# Patient Record
Sex: Female | Born: 1965 | Race: Black or African American | Hispanic: No | Smoking: Never smoker
Health system: Southern US, Community
[De-identification: ages and names within clinical notes are randomized; demographics above are authoritative.]

## PROBLEM LIST (undated history)

## (undated) DIAGNOSIS — E559 Vitamin D deficiency, unspecified: Secondary | ICD-10-CM

## (undated) DIAGNOSIS — D509 Iron deficiency anemia, unspecified: Secondary | ICD-10-CM

## (undated) DIAGNOSIS — Z8669 Personal history of other diseases of the nervous system and sense organs: Secondary | ICD-10-CM

## (undated) HISTORY — DX: Vitamin D deficiency, unspecified: E55.9

## (undated) HISTORY — DX: Iron deficiency anemia, unspecified: D50.9

## (undated) HISTORY — DX: Personal history of other diseases of the nervous system and sense organs: Z86.69

---

## 2016-01-19 ENCOUNTER — Telehealth: Payer: Self-pay | Admitting: Cardiology

## 2016-01-19 NOTE — Telephone Encounter (Signed)
Received records from [facility] for appointment on 02/07/16 with Dr Herbie BaltimoreHarding.  [facility] for Dr Elissa HeftyHarding's schedule on 02/07/16. lp

## 2016-02-07 ENCOUNTER — Ambulatory Visit (INDEPENDENT_AMBULATORY_CARE_PROVIDER_SITE_OTHER): Payer: Commercial Managed Care - HMO | Admitting: Cardiology

## 2016-02-07 ENCOUNTER — Encounter: Payer: Self-pay | Admitting: Cardiology

## 2016-02-07 VITALS — BP 100/66 | HR 77 | Ht 66.5 in | Wt 135.4 lb

## 2016-02-07 DIAGNOSIS — R002 Palpitations: Secondary | ICD-10-CM

## 2016-02-07 DIAGNOSIS — R9431 Abnormal electrocardiogram [ECG] [EKG]: Secondary | ICD-10-CM

## 2016-02-07 NOTE — Progress Notes (Signed)
PCP: Farris Has, MD  Clinic Note: Chief Complaint  Patient presents with  . New Evaluation    Referred by Farris Has, MD for evlauation of RBBB  pt states no Sx.     HPI: Akita Maxim is a 50 y.o. female with a PMH below who presents today for abnormal EKG - RBBB & HTN.She takes spironolactone for hair growth, and had some hyperkalemia. She is therefore now on HCTZ for additional blood pressure control and cataract in the potassium load.  I don't have the EKG from PCP visit.  Romilda Proby was last seen on 01/16/2016 by her PCP for sensation of heart beating fast. It happened a couple nights and then on the day of evaluation. These episodes were not associated with any dyspnea or chest discomfort or dizziness. No syncope/near syncope.   Associated with drinking a shot espresso. Think there was concern of possible atrial flutter.  Recent Hospitalizations: None  Studies Reviewed: None  Interval History: Ms. Grandison presents here today for evaluation of an abnormal EKG read as a right bundle branch block. She noted some palpitation episodes. In early May, for about a week she had several episodes of feeling her heart rate being fast and regular. She felt like she needs a good deep breath to try to stop it. The episodes only lasted less than a few minutes. On the first a wraparound to 3 times as it did the second day. On the third day she went to her PCPs office. She saw PA 2*MM back to see the PCP after the weekend. That time she noted there was despite every other day. It happened at work during lunch. It was not this of exertional. She thinks the symptoms improved with walking around.  Unfortunately, she has not had any further episodes of palpitations since her PCP visit.  No chest pain or shortness of breath with rest or exertion.  No PND, orthopnea or edema.  No lightheadedness, dizziness, weakness or syncope/near syncope. No TIA/amaurosis fugax symptoms. No melena,  hematochezia, hematuria, or epstaxis. No claudication.  ROS: A comprehensive was performed. Review of Systems  Constitutional: Negative for fever, chills and malaise/fatigue.  HENT: Negative for nosebleeds.   Respiratory: Negative for cough, sputum production and shortness of breath.   Cardiovascular: Positive for palpitations. Negative for chest pain.  Gastrointestinal: Negative for blood in stool and melena.  Genitourinary: Negative for hematuria.  Musculoskeletal: Negative for myalgias, joint pain and falls.  Neurological: Negative for dizziness (No dizziness with palpitations), focal weakness, seizures, loss of consciousness and headaches.  Endo/Heme/Allergies: Does not bruise/bleed easily.  Psychiatric/Behavioral: Negative for depression, memory loss and substance abuse. The patient is not nervous/anxious and does not have insomnia.   All other systems reviewed and are negative.   Past Medical History  Diagnosis Date  . History of migraine headaches   . Vitamin D deficiency   . Iron deficiency anemia     History of microscopic hematuria    Past Surgical History  Procedure Laterality Date  . Cesarean section  2002    Prior to Admission medications   Medication Sig Start Date End Date Taking? Authorizing Provider  finasteride (PROSCAR) 5 MG tablet Take 1 tablet by mouth daily. 06/23/15  Yes Historical Provider, MD  hydrochlorothiazide (HYDRODIURIL) 25 MG tablet Take 25 mg by mouth daily. 01/07/16  Yes Historical Provider, MD  LO LOESTRIN FE 1 MG-10 MCG / 10 MCG tablet Take 1 tablet by mouth daily. 01/31/16  Yes Historical Provider, MD  Lysine 1000 MG TABS Take 1 tablet by mouth daily.   Yes Historical Provider, MD  spironolactone (ALDACTONE) 100 MG tablet Take 100 mg by mouth daily. 06/23/15  Yes Historical Provider, MD   No Known Allergies   Social History   Social History  . Marital Status: Unknown    Spouse Name: N/A  . Number of Children: N/A  . Years of Education:  N/A   Social History Main Topics  . Smoking status: Never Smoker   . Smokeless tobacco: Never Used  . Alcohol Use: 2.4 oz/week    4 Glasses of wine per week  . Drug Use: No  . Sexual Activity: Yes   Other Topics Concern  . None   Social History Narrative   Married mother of one. Lives with her husband and daughter 33.    Works as a Psychologist, occupational for [employer].   Phone number [phone]   Thanks roughly 1 cup of coffee a day   Family History  Problem Relation Age of Onset  . Hypertension Mother   . Alcoholism Father     Died early  . Diabetes Paternal Grandmother   . Hypertension Sister     Wt Readings from Last 3 Encounters:  02/07/16 135 lb 6.4 oz (61.417 kg)    PHYSICAL EXAM BP 100/66 mmHg  Pulse 77  Ht 5' 6.5" (1.689 m)  Wt 135 lb 6.4 oz (61.417 kg)  BMI 21.53 kg/m2 General appearance: alert, cooperative, appears stated age, no distress & healthy-appearing. Well-nourished and well-groomed. Pleasant mood and affect. HEENT: [place]/AT, EOMI, MMM, anicteric sclera Neck: no adenopathy, no carotid bruit and no JVD Lungs: clear to auscultation bilaterally, normal percussion bilaterally and non-labored Heart: regular rate and rhythm, S1&S2 normal, no murmur, click, rub or gallop; nondisplaced PMI Abdomen: soft, non-tender; bowel sounds normal; no masses,  no organomegaly; no HJR Extremities: extremities normal, atraumatic, no cyanosis, or edema Pulses: 2+ and symmetric;  Skin: mobility and turgor normal, no evidence of bleeding or bruising and no lesions noted  Neurologic: Mental status: Alert, oriented, thought content appropriate Cranial nerves: normal (II-XII grossly intact)    Adult ECG Report  Rate: 77 ;  Rhythm: normal sinus rhythm and Normal axis, intervals and durations. Normal EKG.;   Narrative Interpretation: Normal EKG. No evidence of RBBB Other studies Reviewed: Additional studies/ records that were reviewed today include:  Recent Labs:  Sodium 137, potassium 4.1,  chloride 101, bicarbonate 27, BUN 17, creatinine 1.1, glucose 103, calcium 10.6. TSH 2.08   ASSESSMENT / PLAN: Problem List Items Addressed This Visit    Palpitations - Primary    Unfortunately, I would like to know what we are actually diagnosing. Some of her symptoms sound like they may be SVT, but we don't have any data to support this. Just the short nature of symptoms and the fact that she did not have time to feel overly symptomatic basically it could be SVT versus PACs and PVCs. Since he is not having any more symptoms, I don't know whether we would capture anything on a monitor. His been a couple weeks with no symptoms. At this point I think what I would prefer to do is have her contact us if his symptoms recur and we can order a monitor. I will be reluctant to think about a double treatment unless there is a diagnosis. I also don't know that any additional cardiac evaluation is warranted until we know what is what treating.  Would prefer not to treat with beta  blocker calcium channel blocker unless she becomes overly symptomatic.  With palpitations and a potential bundle-branch block on EKG, we did check an echocardiogram just to ensure that no structural abnormalities such as mitral prolapse.      Relevant Orders   EKG 12-Lead (Completed)   ECHOCARDIOGRAM COMPLETE   Abnormal finding on EKG    I don't see any and all findings on our current EKG. Unfortunately don't have EKG for the PCP office to compare to. There is no suggestion of right bundle branch block on this EKG. Regardless, right bundle branch block could not be considered to be anything more than a benign finding.      Relevant Orders   EKG 12-Lead (Completed)   ECHOCARDIOGRAM COMPLETE      Current medicines are reviewed at length with the patient today. (+/- concerns) none The following changes have been made: None Studies Ordered:   Orders Placed This Encounter  Procedures  . EKG 12-Lead  . ECHOCARDIOGRAM  COMPLETE   F/U 6 MONTHS WITH DR Herbie BaltimoreHARDING   Bryan Lemmaavid Harding, M.D., M.S. Interventional Cardiologist   Pager # [phone] Phone # [phone] [address] [place], [place] [id number]

## 2016-02-07 NOTE — Patient Instructions (Signed)
If you have any episodes for palpations , call office will place a 48 hour monitors.   Your physician has requested that you have an echocardiogram at [facility]. Echocardiography is a painless test that uses sound waves to create images of your heart. It provides your doctor with information about the size and shape of your heart and how well your heart's chambers and valves are working. This procedure takes approximately one hour. There are no restrictions for this procedure.  Your physician wants you to follow-up in: 6 MONTHS WITH DR Herbie BaltimoreHARDING-  You will receive a reminder letter in the mail two months in advance. If you don't receive a letter, please call our office to schedule the follow-up appointment.  If you need a refill on your cardiac medications before your next appointment, please call your pharmacy.

## 2016-02-09 ENCOUNTER — Encounter: Payer: Self-pay | Admitting: Cardiology

## 2016-02-09 DIAGNOSIS — R9431 Abnormal electrocardiogram [ECG] [EKG]: Secondary | ICD-10-CM | POA: Insufficient documentation

## 2016-02-09 DIAGNOSIS — R002 Palpitations: Secondary | ICD-10-CM | POA: Insufficient documentation

## 2016-02-09 NOTE — Assessment & Plan Note (Signed)
I don't see any and all findings on our current EKG. Unfortunately don't have EKG for the PCP office to compare to. There is no suggestion of right bundle branch block on this EKG. Regardless, right bundle branch block could not be considered to be anything more than a benign finding.

## 2016-02-09 NOTE — Assessment & Plan Note (Addendum)
Unfortunately, I would like to know what we are actually diagnosing. Some of her symptoms sound like they may be SVT, but we don't have any data to support this. Just the short nature of symptoms and the fact that she did not have time to feel overly symptomatic basically it could be SVT versus PACs and PVCs. Since he is not having any more symptoms, I don't know whether we would capture anything on a monitor. His been a couple weeks with no symptoms. At this point I think what I would prefer to do is have her contact us if his symptoms recur and we can order a monitor. I will be reluctant to think about a double treatment unless there is a diagnosis. I also don't know that any additional cardiac evaluation is warranted until we know what is what treating.  Would prefer not to treat with beta blocker calcium channel blocker unless she becomes overly symptomatic.  With palpitations and a potential bundle-branch block on EKG, we did check an echocardiogram just to ensure that no structural abnormalities such as mitral prolapse.

## 2016-02-28 ENCOUNTER — Ambulatory Visit (HOSPITAL_COMMUNITY): Payer: Commercial Managed Care - HMO | Attending: Cardiology

## 2016-02-28 ENCOUNTER — Other Ambulatory Visit: Payer: Self-pay

## 2016-02-28 DIAGNOSIS — I10 Essential (primary) hypertension: Secondary | ICD-10-CM | POA: Diagnosis not present

## 2016-02-28 DIAGNOSIS — R002 Palpitations: Secondary | ICD-10-CM | POA: Insufficient documentation

## 2016-02-28 DIAGNOSIS — R9431 Abnormal electrocardiogram [ECG] [EKG]: Secondary | ICD-10-CM | POA: Insufficient documentation

## 2016-02-28 DIAGNOSIS — I4510 Unspecified right bundle-branch block: Secondary | ICD-10-CM | POA: Insufficient documentation

## 2016-02-28 LAB — ECHOCARDIOGRAM COMPLETE
Ao-asc: 26 cm
E/e' ratio: 5.85
EWDT: 285 ms
FS: 35 % (ref 28–44)
IV/PV OW: 1.1
LA ID, A-P, ES: 29 mm
LA diam end sys: 29 mm
LA vol index: 23.6 mL/m2
LADIAMINDEX: 1.71 cm/m2
LAVOL: 40 mL
LAVOLA4C: 41 mL
LV E/e' medial: 5.85
LV E/e'average: 5.85
LV TDI E'LATERAL: 11.3
LV dias vol: 69 mL (ref 46–106)
LV e' LATERAL: 11.3 cm/s
LV sys vol index: 16 mL/m2
LVDIAVOLIN: 41 mL/m2
LVOT SV: 40 mL
LVOT VTI: 14 cm
LVOT area: 2.84 cm2
LVOT peak grad rest: 2 mmHg
LVOT peak vel: 77.5 cm/s
LVOTD: 19 mm
LVSYSVOL: 28 mL (ref 14–42)
MV Dec: 285
MV pk E vel: 66.1 m/s
MVPKAVEL: 76 m/s
PW: 7.08 mm — AB (ref 0.6–1.1)
RV LATERAL S' VELOCITY: 13.2 cm/s
Simpson's disk: 59
Stroke v: 41 ml
TDI e' medial: 9.16

## 2016-02-29 NOTE — Progress Notes (Signed)
Quick Note:  Echo results: Good news: Essentially normal echocardiogram and normal pump function and normal valve function.  No regional wall motion abnormalities = No signs to suggest heart attack.. EF: 55-60%.  Bryan Lemmaavid Harding, MD  Pls forward to: Farris HasAaron Morrow, MD   ______

## 2016-03-05 ENCOUNTER — Telehealth: Payer: Self-pay | Admitting: *Deleted

## 2016-03-05 NOTE — Telephone Encounter (Signed)
-----   Message from Marykay Lexavid W Harding, MD sent at 02/29/2016  2:30 PM EDT ----- Echo results: Good news: Essentially normal echocardiogram and normal pump function and normal valve function.   No regional wall motion abnormalities = No signs to suggest heart attack.. EF: 55-60%.  Bryan Lemmaavid Harding, MD  Pls forward to: Farris HasAaron Morrow, MD

## 2016-03-05 NOTE — Telephone Encounter (Signed)
Spoke to patient. Result given . Verbalized understanding Routed to Dr Kateri PlummerMorrow

## 2016-10-03 ENCOUNTER — Other Ambulatory Visit: Payer: Self-pay | Admitting: Obstetrics and Gynecology

## 2016-10-03 DIAGNOSIS — R928 Other abnormal and inconclusive findings on diagnostic imaging of breast: Secondary | ICD-10-CM

## 2016-10-09 ENCOUNTER — Ambulatory Visit
Admission: RE | Admit: 2016-10-09 | Discharge: 2016-10-09 | Disposition: A | Discharge status: Home / Self Care | Payer: Commercial Managed Care - HMO | Source: Ambulatory Visit | Attending: Obstetrics and Gynecology | Admitting: Obstetrics and Gynecology

## 2016-10-09 DIAGNOSIS — R928 Other abnormal and inconclusive findings on diagnostic imaging of breast: Secondary | ICD-10-CM

## 2018-05-29 IMAGING — MG 2D DIGITAL DIAGNOSTIC UNILATERAL RIGHT MAMMOGRAM WITH CAD AND AD
6 series · 6 of 14 positions shown · non-contrast
Comparison: October 02, 2016 and earlier priors

CLINICAL DATA: Possible distortion right breast identified on
recent screening mammogram.

EXAM:
2D DIGITAL DIAGNOSTIC UNILATERAL RIGHT MAMMOGRAM WITH CAD AND
ADJUNCT TOMO

[R MLO]
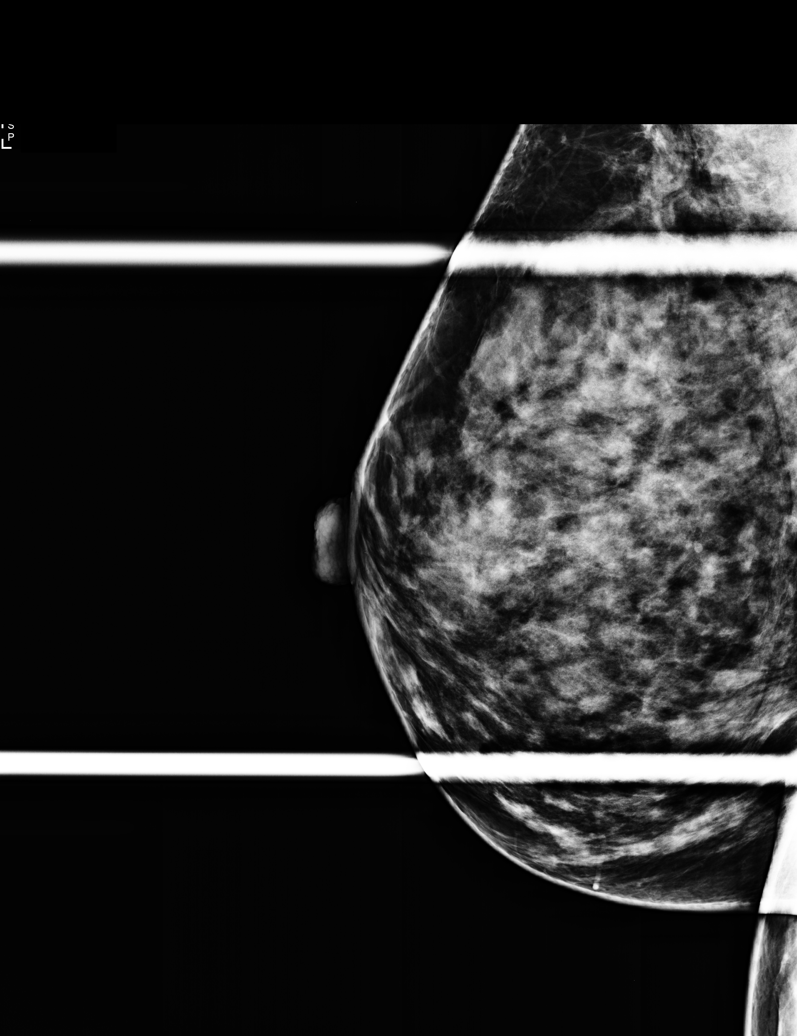

[R CC synth-2D]
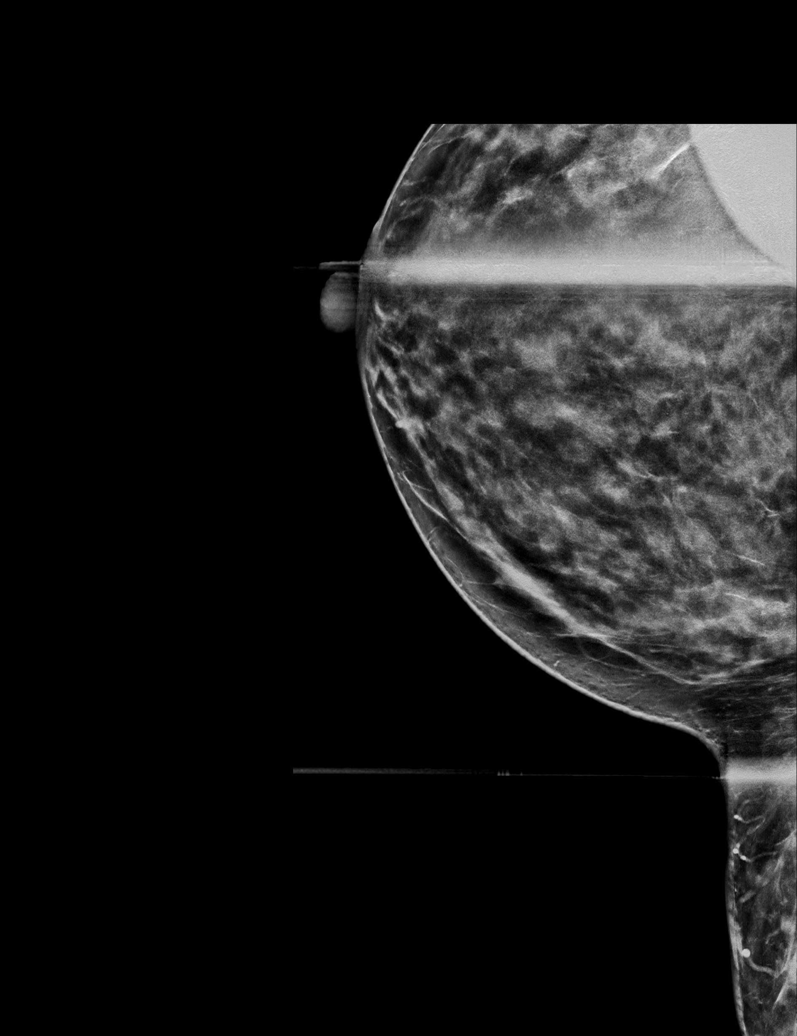

[R MLO synth-2D]
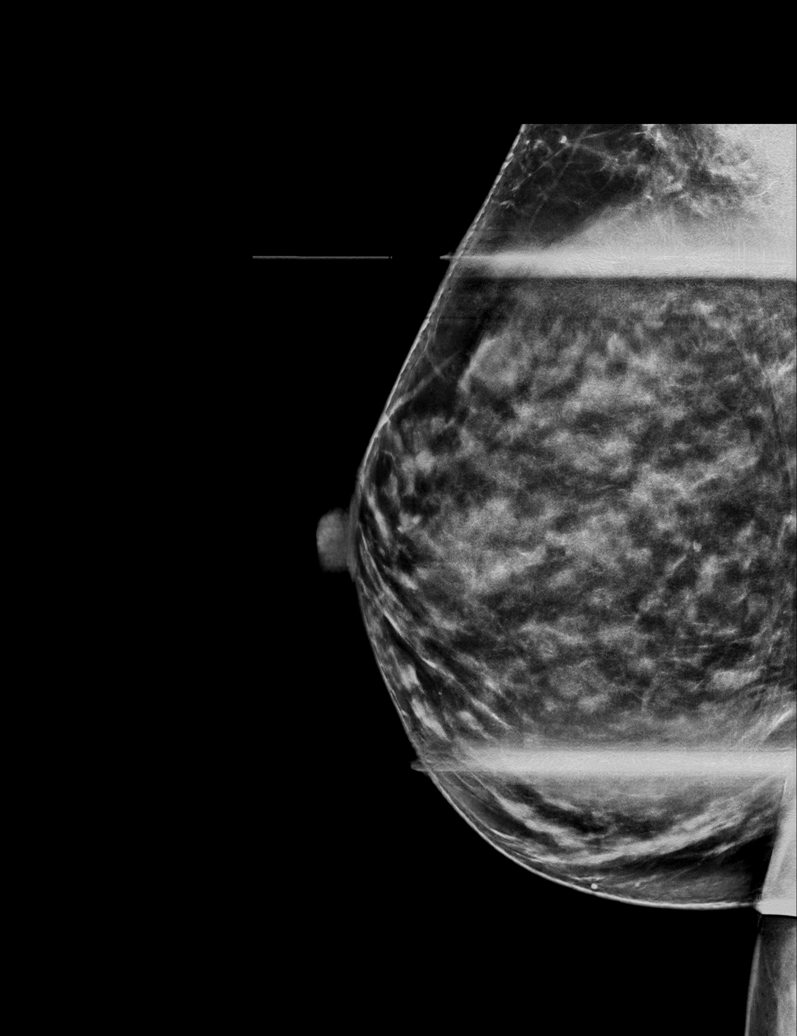

[R CC]
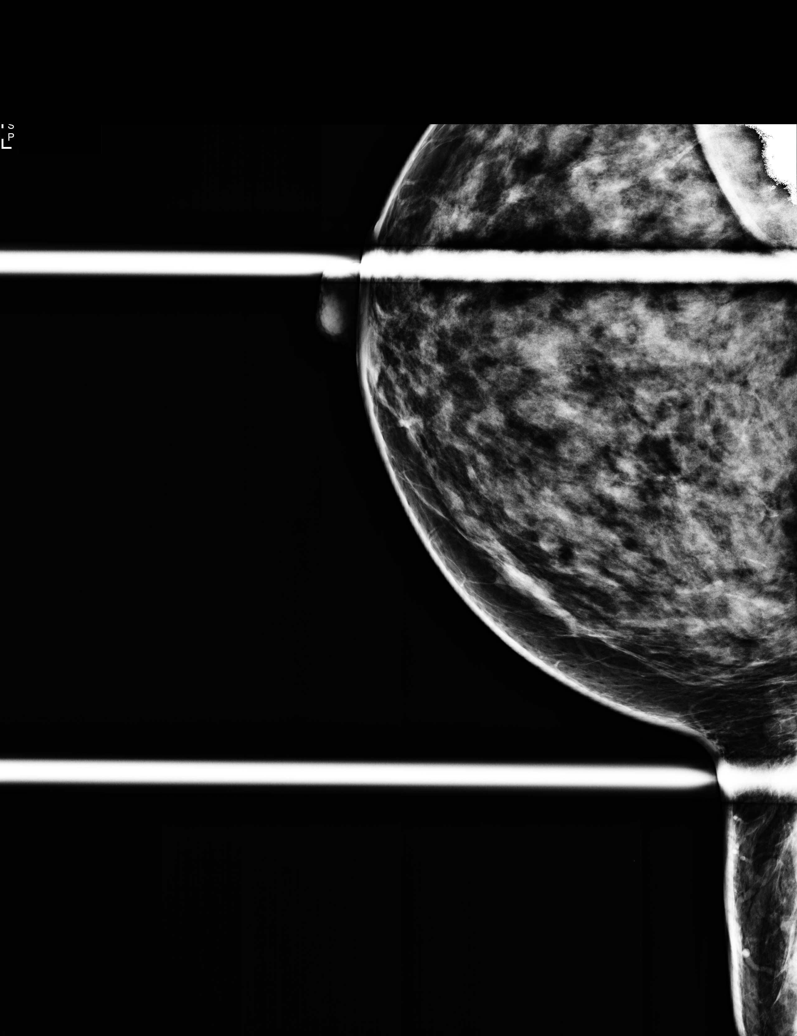

[R MLO tomo · tomo slice 35/68.0]
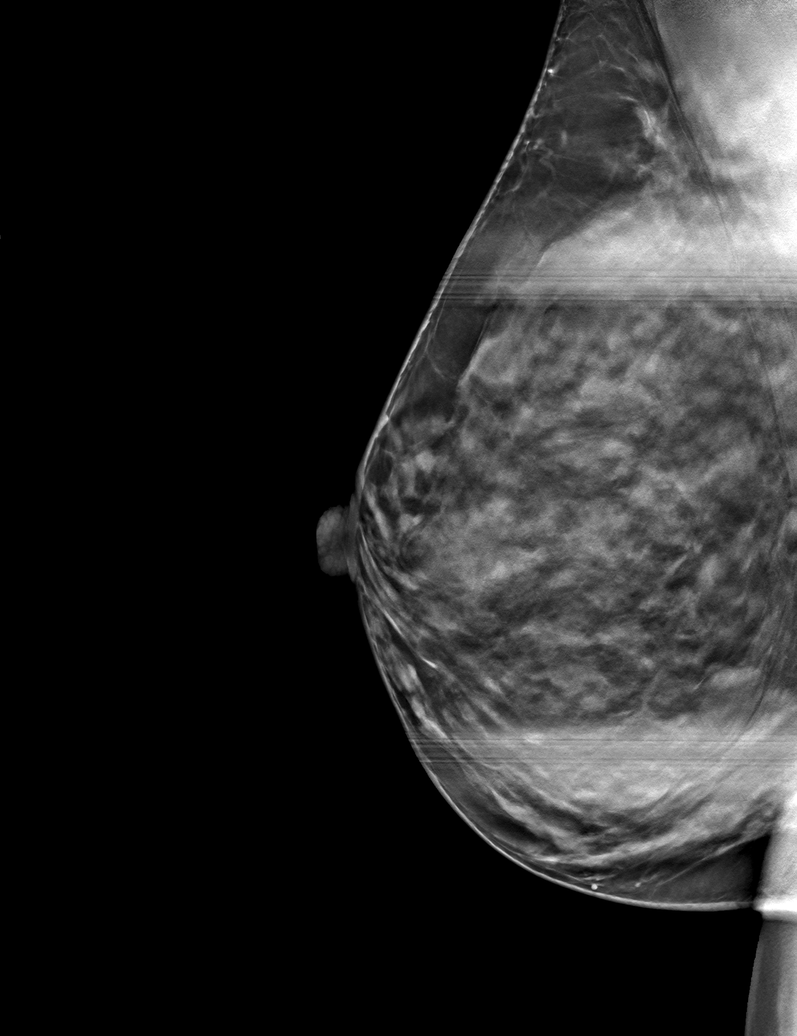

[R CC tomo · tomo slice 34/67.0]
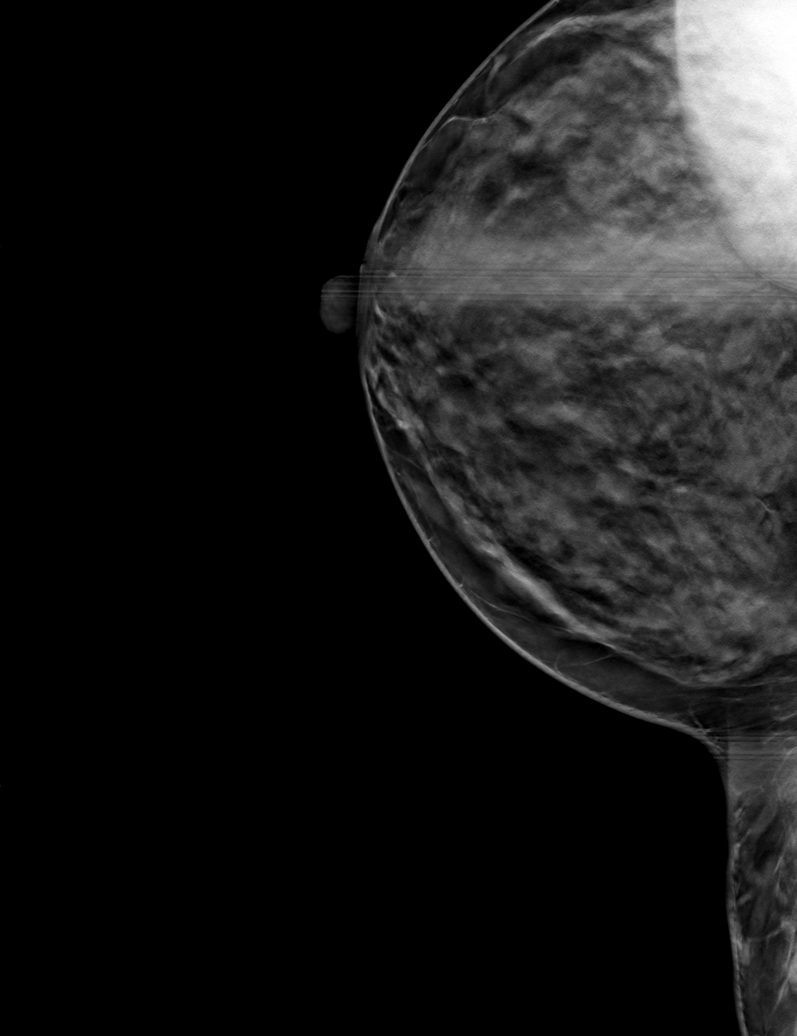

[6 of 14 positions shown; findings below may reference images not displayed]

ACR Breast Density Category c: The breast tissue is heterogeneously
dense, which may obscure small masses.
FINDINGS: Focal spot compression views of the medial right breast including
tomography show no architectural distortion or mass. The parenchymal
pattern in this region of the right breast appears stable compared
to prior mammograms.

Mammographic images were processed with CAD.
IMPRESSION: No evidence of malignancy in the right breast.

RECOMMENDATION:
Screening mammogram in one year.(Code:[id number]-[id number])

I have discussed the findings and recommendations with the patient.
Results were also provided in writing at the conclusion of the
visit. If applicable, a reminder letter will be sent to the patient
regarding the next appointment.

BI-RADS CATEGORY  1: Negative.

## 2020-06-15 ENCOUNTER — Other Ambulatory Visit: Payer: Commercial Managed Care - HMO

## 2020-06-15 ENCOUNTER — Other Ambulatory Visit: Payer: Self-pay

## 2020-06-15 DIAGNOSIS — Z20822 Contact with and (suspected) exposure to covid-19: Secondary | ICD-10-CM

## 2020-06-16 LAB — NOVEL CORONAVIRUS, NAA: SARS-CoV-2, NAA: NOT DETECTED

## 2020-06-16 LAB — SPECIMEN STATUS REPORT

## 2020-06-16 LAB — SARS-COV-2, NAA 2 DAY TAT

## 2020-06-20 ENCOUNTER — Other Ambulatory Visit: Payer: Commercial Managed Care - HMO

## 2020-06-20 ENCOUNTER — Other Ambulatory Visit: Payer: Self-pay | Admitting: *Deleted

## 2020-06-20 DIAGNOSIS — Z20822 Contact with and (suspected) exposure to covid-19: Secondary | ICD-10-CM

## 2020-06-21 LAB — SPECIMEN STATUS REPORT

## 2020-06-21 LAB — SARS-COV-2, NAA 2 DAY TAT

## 2020-06-21 LAB — NOVEL CORONAVIRUS, NAA: SARS-CoV-2, NAA: NOT DETECTED

## 2020-08-30 ENCOUNTER — Other Ambulatory Visit: Payer: Self-pay

## 2020-08-30 ENCOUNTER — Other Ambulatory Visit: Payer: Commercial Managed Care - HMO

## 2020-08-30 DIAGNOSIS — Z20822 Contact with and (suspected) exposure to covid-19: Secondary | ICD-10-CM

## 2020-08-31 LAB — SARS-COV-2, NAA 2 DAY TAT

## 2020-08-31 LAB — NOVEL CORONAVIRUS, NAA: SARS-CoV-2, NAA: NOT DETECTED

## 2020-10-01 DIAGNOSIS — Z20822 Contact with and (suspected) exposure to covid-19: Secondary | ICD-10-CM | POA: Diagnosis not present

## 2020-10-01 DIAGNOSIS — Z03818 Encounter for observation for suspected exposure to other biological agents ruled out: Secondary | ICD-10-CM | POA: Diagnosis not present

## 2021-11-24 DIAGNOSIS — Z23 Encounter for immunization: Secondary | ICD-10-CM | POA: Diagnosis not present

## 2022-04-09 DIAGNOSIS — R030 Elevated blood-pressure reading, without diagnosis of hypertension: Secondary | ICD-10-CM | POA: Diagnosis not present

## 2022-04-09 DIAGNOSIS — Z13220 Encounter for screening for lipoid disorders: Secondary | ICD-10-CM | POA: Diagnosis not present

## 2022-04-09 DIAGNOSIS — Z0000 Encounter for general adult medical examination without abnormal findings: Secondary | ICD-10-CM | POA: Diagnosis not present

## 2022-04-09 DIAGNOSIS — L309 Dermatitis, unspecified: Secondary | ICD-10-CM | POA: Diagnosis not present

## 2022-04-09 DIAGNOSIS — B001 Herpesviral vesicular dermatitis: Secondary | ICD-10-CM | POA: Diagnosis not present

## 2023-01-01 DIAGNOSIS — R319 Hematuria, unspecified: Secondary | ICD-10-CM | POA: Diagnosis not present

## 2023-01-01 DIAGNOSIS — L659 Nonscarring hair loss, unspecified: Secondary | ICD-10-CM | POA: Diagnosis not present

## 2023-02-05 DIAGNOSIS — R3121 Asymptomatic microscopic hematuria: Secondary | ICD-10-CM | POA: Diagnosis not present

## 2023-02-05 DIAGNOSIS — R319 Hematuria, unspecified: Secondary | ICD-10-CM | POA: Diagnosis not present

## 2023-02-28 DIAGNOSIS — R3121 Asymptomatic microscopic hematuria: Secondary | ICD-10-CM | POA: Diagnosis not present

## 2023-04-09 DIAGNOSIS — R3121 Asymptomatic microscopic hematuria: Secondary | ICD-10-CM | POA: Diagnosis not present

## 2023-04-09 DIAGNOSIS — R319 Hematuria, unspecified: Secondary | ICD-10-CM | POA: Diagnosis not present

## 2023-04-22 DIAGNOSIS — R7309 Other abnormal glucose: Secondary | ICD-10-CM | POA: Diagnosis not present

## 2023-04-22 DIAGNOSIS — R030 Elevated blood-pressure reading, without diagnosis of hypertension: Secondary | ICD-10-CM | POA: Diagnosis not present

## 2023-04-22 DIAGNOSIS — E785 Hyperlipidemia, unspecified: Secondary | ICD-10-CM | POA: Diagnosis not present

## 2023-04-26 ENCOUNTER — Other Ambulatory Visit (HOSPITAL_COMMUNITY): Payer: Self-pay | Admitting: Family Medicine

## 2023-04-26 DIAGNOSIS — E785 Hyperlipidemia, unspecified: Secondary | ICD-10-CM

## 2023-06-25 ENCOUNTER — Other Ambulatory Visit (HOSPITAL_COMMUNITY): Payer: Self-pay

## 2023-06-26 ENCOUNTER — Ambulatory Visit (HOSPITAL_COMMUNITY)
Admission: RE | Admit: 2023-06-26 | Discharge: 2023-06-26 | Disposition: A | Discharge status: Home / Self Care | Payer: Self-pay | Source: Ambulatory Visit | Attending: Family Medicine | Admitting: Family Medicine

## 2023-06-26 DIAGNOSIS — E785 Hyperlipidemia, unspecified: Secondary | ICD-10-CM | POA: Insufficient documentation

## 2023-07-23 DIAGNOSIS — R7309 Other abnormal glucose: Secondary | ICD-10-CM | POA: Diagnosis not present
